# Patient Record
Sex: Female | Born: 1935 | Race: White | Hispanic: No
Health system: Southern US, Community
[De-identification: ages and names within clinical notes are randomized; demographics above are authoritative.]

## PROBLEM LIST (undated history)

## (undated) DIAGNOSIS — I10 Essential (primary) hypertension: Secondary | ICD-10-CM

## (undated) DIAGNOSIS — E119 Type 2 diabetes mellitus without complications: Secondary | ICD-10-CM

## (undated) HISTORY — PX: KIDNEY SURGERY: SHX687

## (undated) HISTORY — PX: OTHER SURGICAL HISTORY: SHX169

---

## 2008-10-14 ENCOUNTER — Ambulatory Visit: Payer: Self-pay | Admitting: Family Medicine

## 2008-10-14 DIAGNOSIS — S99919A Unspecified injury of unspecified ankle, initial encounter: Secondary | ICD-10-CM

## 2008-10-14 DIAGNOSIS — E119 Type 2 diabetes mellitus without complications: Secondary | ICD-10-CM

## 2008-10-14 DIAGNOSIS — I10 Essential (primary) hypertension: Secondary | ICD-10-CM | POA: Insufficient documentation

## 2008-10-14 DIAGNOSIS — S99929A Unspecified injury of unspecified foot, initial encounter: Secondary | ICD-10-CM

## 2008-10-14 DIAGNOSIS — S8990XA Unspecified injury of unspecified lower leg, initial encounter: Secondary | ICD-10-CM

## 2016-07-06 ENCOUNTER — Encounter: Payer: Self-pay | Admitting: Emergency Medicine

## 2016-07-06 ENCOUNTER — Emergency Department (INDEPENDENT_AMBULATORY_CARE_PROVIDER_SITE_OTHER): Payer: Medicare HMO

## 2016-07-06 ENCOUNTER — Emergency Department
Admission: EM | Admit: 2016-07-06 | Discharge: 2016-07-06 | Disposition: A | Discharge status: Home / Self Care | Payer: Medicare HMO | Source: Home / Self Care | Attending: Family Medicine | Admitting: Family Medicine

## 2016-07-06 DIAGNOSIS — S52502A Unspecified fracture of the lower end of left radius, initial encounter for closed fracture: Secondary | ICD-10-CM | POA: Diagnosis not present

## 2016-07-06 DIAGNOSIS — W19XXXA Unspecified fall, initial encounter: Secondary | ICD-10-CM

## 2016-07-06 DIAGNOSIS — S52615A Nondisplaced fracture of left ulna styloid process, initial encounter for closed fracture: Secondary | ICD-10-CM

## 2016-07-06 HISTORY — DX: Essential (primary) hypertension: I10

## 2016-07-06 HISTORY — DX: Type 2 diabetes mellitus without complications: E11.9

## 2016-07-06 NOTE — ED Triage Notes (Signed)
Patient presents to [facility] with daughter. Complaint of left forearm pain, patient states she fell while vacuuming yesterday

## 2016-07-06 NOTE — ED Provider Notes (Signed)
[facility]    CSN: [id number] Arrival date & time: 07/06/16  1158     History   Chief Complaint Chief Complaint  Patient presents with  . Arm Pain    HPI Lindsey Douglas is a 83 y.o. female.   Patient tripped over a vacuum cleaner wire yesterday at 4pm at home.  She braced herself with her left hand/wrist, resulting in persistent left hand and wrist pain.  No loss of consciousness.  No other injury    Wrist Pain  This is a new problem. The current episode started yesterday. The problem occurs constantly. The problem has not changed since onset.Associated symptoms comments: none. Nothing relieves the symptoms. Treatments tried: ice pack. The treatment provided mild relief.    Past Medical History:  Diagnosis Date  . Diabetes mellitus without complication (HCC)   . Hypertension     Patient Active Problem List   Diagnosis Date Noted  . DIABETES MELLITUS, TYPE II 10/14/2008  . HYPERTENSION 10/14/2008  . TOE INJURY 10/14/2008    Past Surgical History:  Procedure Laterality Date  . kidney cancer    . KIDNEY SURGERY      OB History    No data available       Home Medications    Prior to Admission medications   Medication Sig Start Date End Date Taking? Authorizing Provider  amlodipine-atorvastatin (CADUET) 2.5-20 MG tablet Take 1 tablet by mouth daily.   Yes Historical Provider, MD  escitalopram (LEXAPRO) 10 MG tablet Take 10 mg by mouth daily.   Yes Historical Provider, MD  glipiZIDE (GLUCOTROL) 10 MG tablet Take 10 mg by mouth daily before breakfast.   Yes Historical Provider, MD  linagliptin (TRADJENTA) 5 MG TABS tablet Take 5 mg by mouth daily.   Yes Historical Provider, MD  lisinopril (PRINIVIL,ZESTRIL) 20 MG tablet Take 20 mg by mouth daily.   Yes Historical Provider, MD    Family History History reviewed. No pertinent family history.  Social History Social History  Substance Use Topics  . Smoking status: Passive Smoke Exposure -  Never Smoker  . Smokeless tobacco: Current User  . Alcohol use No     Allergies   Contrast media [iodinated diagnostic agents]   Review of Systems Review of Systems  All other systems reviewed and are negative.    Physical Exam Triage Vital Signs ED Triage Vitals [07/06/16 1226]  Enc Vitals Group     BP (!) 169/69     Pulse Rate 63     Resp      Temp 97.7 F (36.5 C)     Temp Source Oral     SpO2 98 %     Weight 144 lb 8 oz (65.5 kg)     Height      Head Circumference      Peak Flow      Pain Score 8     Pain Loc      Pain Edu?      Excl. in GC?    No data found.   Updated Vital Signs BP (!) 169/69 (BP Location: Left Arm)   Pulse 63   Temp 97.7 F (36.5 C) (Oral)   Wt 144 lb 8 oz (65.5 kg)   SpO2 98%   BMI 25.60 kg/m   Visual Acuity Right Eye Distance:   Left Eye Distance:   Bilateral Distance:    Right Eye Near:   Left Eye Near:    Bilateral Near:  Physical Exam  Constitutional: She appears well-developed and well-nourished. No distress.  HENT:  Head: Atraumatic.  Eyes: Pupils are equal, round, and reactive to light.  Neck: Normal range of motion.  Cardiovascular: Normal rate.   Pulmonary/Chest: Effort normal.  Musculoskeletal:       Left wrist: She exhibits decreased range of motion, tenderness, bony tenderness and swelling. She exhibits no deformity and no laceration.       Arms: Left wrist has diffuse swelling and tenderness to palpation over distal radius and ulna.  Distal neurovascular function is intact.   Neurological: She is alert.  Skin: Skin is warm and dry.  Nursing note and vitals reviewed.    UC Treatments / Results  Labs (all labs ordered are listed, but only abnormal results are displayed) Labs Reviewed - No data to display  EKG  EKG Interpretation None       Radiology Dg Forearm Left  Result Date: 07/06/2016 CLINICAL DATA:  83 year old female with left wrist pain after falling yesterday EXAM: LEFT FOREARM -  2 VIEW COMPARISON:  None FINDINGS: Acute mildly impacted distal radius fracture with involvement of the articular surface. Additionally, there is a nondisplaced chip fracture of the ulnar styloid. On the lateral view there is a nonspecific bony fragment dorsal to the carpal bones which may reflect an additional fracture site. The proximal and mid radius and ulna are intact and unremarkable. Soft tissue swelling is present about the wrist. IMPRESSION: 1. Acute mildly comminuted and impacted distal radius fracture with involvement of the articular surface. 2. Nondisplaced chip fracture from the ulnar styloid. 3. Bony fragment posterior to the proximal carpal row on the lateral view concerning for an additional carpal fracture. Recommend dedicated radiographs of the wrist. Electronically Signed   By: Malachy Moan M.D.   On: 07/06/2016 12:56    Procedures Procedures (including critical care time)  Medications Ordered in UC Medications - No data to display   Initial Impression / Assessment and Plan / UC Course  I have reviewed the triage vital signs and the nursing notes.  Pertinent labs & imaging results that were available during my care of the patient were reviewed by me and considered in my medical decision making (see chart for details).    Discussed with Dr. Rodney Langton.  Sugar tong splint applied and sling dispensed. Elevate arm (wear sling).  Apply ice pack for 30 to 40 minutes, 3 to 4 times daily  Continue until pain and swelling decrease.  May take Tylenol as needed for pain. Recommend calcium and vitamin D supplement such as Citracal + D. Followup with Dr. Rodney Langton or Dr. Clementeen [facility] ([facility]) in 48 hours for casting and fracture management.    Final Clinical Impressions(s) / UC Diagnoses   Final diagnoses:  Closed fracture of distal end of left radius, unspecified fracture morphology, initial encounter  Nondisplaced fracture of left ulna styloid  process, initial encounter for closed fracture    New Prescriptions New Prescriptions   No medications on file     Lattie Haw, MD 07/06/16 1406

## 2016-07-06 NOTE — Discharge Instructions (Signed)
Elevate arm (wear sling).  Apply ice pack for 30 to 40 minutes, 3 to 4 times daily  Continue until pain and swelling decrease.  May take Tylenol as needed for pain. Recommend calcium and vitamin D supplement such as Citracal + D.

## 2016-07-08 ENCOUNTER — Ambulatory Visit (INDEPENDENT_AMBULATORY_CARE_PROVIDER_SITE_OTHER): Payer: Medicare HMO

## 2016-07-08 ENCOUNTER — Ambulatory Visit (INDEPENDENT_AMBULATORY_CARE_PROVIDER_SITE_OTHER): Payer: Medicare HMO | Admitting: Sports Medicine

## 2016-07-08 ENCOUNTER — Encounter: Payer: Self-pay | Admitting: Sports Medicine

## 2016-07-08 DIAGNOSIS — S52602A Unspecified fracture of lower end of left ulna, initial encounter for closed fracture: Secondary | ICD-10-CM | POA: Diagnosis not present

## 2016-07-08 DIAGNOSIS — S52502D Unspecified fracture of the lower end of left radius, subsequent encounter for closed fracture with routine healing: Secondary | ICD-10-CM | POA: Diagnosis not present

## 2016-07-08 DIAGNOSIS — W19XXXD Unspecified fall, subsequent encounter: Secondary | ICD-10-CM

## 2016-07-08 DIAGNOSIS — S52502A Unspecified fracture of the lower end of left radius, initial encounter for closed fracture: Secondary | ICD-10-CM

## 2016-07-08 MED ORDER — HYDROCODONE-ACETAMINOPHEN 10-325 MG PO TABS: 0.5000 | ORAL_TABLET | Freq: Three times a day (TID) | ORAL | 0 refills | Status: AC | PRN

## 2016-07-08 NOTE — Assessment & Plan Note (Signed)
Closed reduction as above, hydrocodone for pain, Sugar tong splint applied. Postreduction x-rays. Return to see me next week, repeat x-rays before visit.

## 2016-07-08 NOTE — Progress Notes (Signed)
   Subjective:    I'm seeing this patient as a consultation for:  Dr. Donna Christen  CC: left arm fracture  HPI: This is a pleasant 83 year old female, this weekend she fell onto an outstretched hand, she had a fracture with immediate pain, swelling, bruising. In urgent care x-rays showed a partially comminuted dorsally impacted distal radius fracture, she was placed in a sugar tong splint appropriately and referred to me for further evaluation and definitive treatment.  Past medical history:  Negative.  See flowsheet/record as well for more information.  Surgical history: Negative.  See flowsheet/record as well for more information.  Family history: Negative.  See flowsheet/record as well for more information.  Social history: Negative.  See flowsheet/record as well for more information.  Allergies, and medications have been entered into the medical record, reviewed, and no changes needed.   Review of Systems: No headache, visual changes, nausea, vomiting, diarrhea, constipation, dizziness, abdominal pain, skin rash, fevers, chills, night sweats, weight loss, swollen lymph nodes, body aches, joint swelling, muscle aches, chest pain, shortness of breath, mood changes, visual or auditory hallucinations.   Objective:   General: Well Developed, well nourished, and in no acute distress.  Neuro/Psych: Alert and oriented x3, extra-ocular muscles intact, able to move all 4 extremities, sensation grossly intact. Skin: Warm and dry, no rashes noted.  Respiratory: Not using accessory muscles, speaking in full sentences, trachea midline.  Cardiovascular: Pulses palpable, no extremity edema. Abdomen: Does not appear distended. Left wrist: Visibly swollen, mild bruising, tender over the distal radius and the distal ulnar styloid. Neurovascularly intact distally.  Procedure:  Fracture Reduction   Risks, benefits, and alternatives explained and consent obtained. Time out conducted. Surface prepped  with alcohol. 10cc lidocaine and bupivacaine infiltrated in a hematoma block. Adequate anesthesia ensured. Fracture reduction: Using finger traps I accentuated the fracture deformity, I then applied axial force and reduced the fracture, it was then splinted with the wrist in flexion with a sugar tong splint. Post reduction films obtained showed improvement in the fracture alignment, specifically radial height and inclination. Pt stable, aftercare and follow-up advised.  Impression and Recommendations:   This case required medical decision making of moderate complexity.  Fracture of distal radius and ulna, left, closed, initial encounter Closed reduction as above, hydrocodone for pain, Sugar tong splint applied. Postreduction x-rays. Return to see me next week, repeat x-rays before visit.

## 2016-07-08 NOTE — Addendum Note (Signed)
Addended by: Monica Becton on: 07/08/2016 05:45 PM   Modules accepted: Orders

## 2016-07-15 ENCOUNTER — Ambulatory Visit (INDEPENDENT_AMBULATORY_CARE_PROVIDER_SITE_OTHER): Payer: Medicare HMO | Admitting: Sports Medicine

## 2016-07-15 ENCOUNTER — Ambulatory Visit (INDEPENDENT_AMBULATORY_CARE_PROVIDER_SITE_OTHER): Payer: Medicare HMO

## 2016-07-15 ENCOUNTER — Encounter: Payer: Self-pay | Admitting: Sports Medicine

## 2016-07-15 DIAGNOSIS — S52602A Unspecified fracture of lower end of left ulna, initial encounter for closed fracture: Principal | ICD-10-CM

## 2016-07-15 DIAGNOSIS — W19XXXD Unspecified fall, subsequent encounter: Secondary | ICD-10-CM | POA: Diagnosis not present

## 2016-07-15 DIAGNOSIS — S52502D Unspecified fracture of the lower end of left radius, subsequent encounter for closed fracture with routine healing: Secondary | ICD-10-CM | POA: Diagnosis not present

## 2016-07-15 DIAGNOSIS — S52502A Unspecified fracture of the lower end of left radius, initial encounter for closed fracture: Secondary | ICD-10-CM

## 2016-07-15 NOTE — Progress Notes (Signed)
  Subjective: 1 week post closed reduction of a left distal radius fracture, doing well.   Objective: General: Well-developed, well-nourished, and in no acute distress. Left arm: Fingers are still swollen as expected, I rewrapped the splint.  X-rays reviewed, they show stability of the anatomic alignment of the reduced distal radius fracture with some resorption.  Assessment/plan:   Fracture of distal radius and ulna, left, closed, initial encounter 1 week post fracture, doing well, I rewrapped the sugar tong splint, return in one week, x-ray before visit. Has not been using the narcotic, I asked her to cut in half and then try it.

## 2016-07-15 NOTE — Assessment & Plan Note (Signed)
1 week post fracture, doing well, I rewrapped the sugar tong splint, return in one week, x-ray before visit. Has not been using the narcotic, I asked her to cut in half and then try it.

## 2016-07-23 ENCOUNTER — Encounter: Payer: Self-pay | Admitting: Sports Medicine

## 2016-07-23 ENCOUNTER — Ambulatory Visit (INDEPENDENT_AMBULATORY_CARE_PROVIDER_SITE_OTHER): Payer: Medicare HMO

## 2016-07-23 ENCOUNTER — Ambulatory Visit (INDEPENDENT_AMBULATORY_CARE_PROVIDER_SITE_OTHER): Payer: Medicare HMO | Admitting: Sports Medicine

## 2016-07-23 DIAGNOSIS — S52602A Unspecified fracture of lower end of left ulna, initial encounter for closed fracture: Secondary | ICD-10-CM

## 2016-07-23 DIAGNOSIS — W19XXXD Unspecified fall, subsequent encounter: Secondary | ICD-10-CM

## 2016-07-23 DIAGNOSIS — S52502A Unspecified fracture of the lower end of left radius, initial encounter for closed fracture: Secondary | ICD-10-CM

## 2016-07-23 DIAGNOSIS — S52502D Unspecified fracture of the lower end of left radius, subsequent encounter for closed fracture with routine healing: Secondary | ICD-10-CM

## 2016-07-23 NOTE — Assessment & Plan Note (Signed)
Doing well post closed reduction of a comminuted left distal radius fracture, transition to a short arm cast, no pain medication needed. She only has mild deformity. Return to see me in 2 weeks.

## 2016-07-23 NOTE — Progress Notes (Signed)
  Subjective:  2 weeks post closed reduction of a left distal radius fracture, doing well.  Objective: General: Well-developed, well-nourished, and in no acute distress. Left wrist: Splint is removed, still tender over the fracture with only mild residual deformity.  Short arm cast placed.  X-rays reviewed, she still has a comminuted distal radius fracture but the radial length, inclination is maintained post reduction.  Assessment/plan:   Fracture of distal radius and ulna, left, closed, initial encounter Doing well post closed reduction of a comminuted left distal radius fracture, transition to a short arm cast, no pain medication needed. She only has mild deformity. Return to see me in 2 weeks.

## 2016-08-06 ENCOUNTER — Ambulatory Visit: Payer: Medicare HMO | Admitting: Sports Medicine

## 2016-08-06 ENCOUNTER — Encounter: Payer: Self-pay | Admitting: Sports Medicine

## 2016-08-06 DIAGNOSIS — S52602A Unspecified fracture of lower end of left ulna, initial encounter for closed fracture: Principal | ICD-10-CM

## 2016-08-06 DIAGNOSIS — S52502A Unspecified fracture of the lower end of left radius, initial encounter for closed fracture: Secondary | ICD-10-CM

## 2016-08-06 NOTE — Progress Notes (Signed)
  Subjective: 4 weeks post closed reduction, doing well, pain-free, has a few areas on the cast that are rubbing her hand.   Objective: General: Well-developed, well-nourished, and in no acute distress. Left wrist: Cast is in good shape, fit is good, swelling in the fingers is improving. I did trim a few bits of the cast to make it more comfortable.  Assessment/plan:   Fracture of distal radius and ulna, left, closed, initial encounter 4 weeks post distal radius fracture closed reduction, cast is in good shape, I did trim a few parts of it. Return in 2 weeks, we will probably remove the cast and get an x-ray at that time.

## 2016-08-06 NOTE — Assessment & Plan Note (Signed)
4 weeks post distal radius fracture closed reduction, cast is in good shape, I did trim a few parts of it. Return in 2 weeks, we will probably remove the cast and get an x-ray at that time.

## 2016-08-20 ENCOUNTER — Ambulatory Visit (INDEPENDENT_AMBULATORY_CARE_PROVIDER_SITE_OTHER): Payer: Medicare HMO

## 2016-08-20 ENCOUNTER — Encounter: Payer: Self-pay | Admitting: Sports Medicine

## 2016-08-20 ENCOUNTER — Ambulatory Visit: Payer: Medicare HMO | Admitting: Sports Medicine

## 2016-08-20 DIAGNOSIS — S52602A Unspecified fracture of lower end of left ulna, initial encounter for closed fracture: Secondary | ICD-10-CM

## 2016-08-20 DIAGNOSIS — W19XXXD Unspecified fall, subsequent encounter: Secondary | ICD-10-CM | POA: Diagnosis not present

## 2016-08-20 DIAGNOSIS — S52502A Unspecified fracture of the lower end of left radius, initial encounter for closed fracture: Secondary | ICD-10-CM

## 2016-08-20 NOTE — Assessment & Plan Note (Signed)
6 weeks post fracture, doing well.  X-rays, physical therapy, return in 4 week

## 2016-08-20 NOTE — Progress Notes (Signed)
  Subjective: 6 weeks post closed reduction of the distal radius fracture, in cast, doing well.  Objective: General: Well-developed, well-nourished, and in no acute distress. Left Wrist: Slight deformity, minimal tenderness over the fracture, otherwise doing well.  Assessment/plan:   Fracture of distal radius and ulna, left, closed, initial encounter  6 weeks post fracture, doing well.  X-rays, physical therapy, return in 4 week

## 2016-08-23 ENCOUNTER — Ambulatory Visit (INDEPENDENT_AMBULATORY_CARE_PROVIDER_SITE_OTHER): Payer: Medicare HMO | Admitting: Physical Therapy

## 2016-08-23 ENCOUNTER — Encounter: Payer: Self-pay | Admitting: Physical Therapy

## 2016-08-23 DIAGNOSIS — M79642 Pain in left hand: Secondary | ICD-10-CM

## 2016-08-23 DIAGNOSIS — M6281 Muscle weakness (generalized): Secondary | ICD-10-CM

## 2016-08-23 DIAGNOSIS — M25642 Stiffness of left hand, not elsewhere classified: Secondary | ICD-10-CM | POA: Diagnosis not present

## 2016-08-23 NOTE — Patient Instructions (Addendum)
Roderic ScarceSusan Fermon Ureta [phone] Use ice for swelling, alternate ice/heat for pain, 10-15 min each  WALKING  Walking is a great form of exercise to increase your strength, endurance and overall fitness.  A walking program can help you start slowly and gradually build endurance as you go.  Everyone's ability is different, so each person's starting point will be different.  You do not have to follow them exactly.  The are just samples. You should simply find out what's right for you and stick to that program.   In the beginning, you'll start off walking 2-3 times a day for short distances.  As you get stronger, you'll be walking further at just 1-2 times per day.  A. You Can Walk For A Certain Length Of Time Each Day    Walk 5 minutes 3 times per day. ( one or two songs )   Increase 2 minutes ( or one song)  every 2-3 days (3 times per day).  Work up to 25-30 minutes (1-2 times per day).   Example:   Day 1-2 5 minutes 3 times per day   Day 7-8 12 minutes 2-3 times per day   Day 13-14 25 minutes 1-2 times per day      Flexion: ROM With MPs Extended - use right hand to apply overpressure to left fingers to form a fist.      Bend left fingers into a fist as far as possible.  Right hand assists.  Repeat _5-10__ times. Repeat with other hand. Do __3-4_ sessions per day.   Finger Opposition    Actively touch left thumb to each fingertip. Start with index finger and proceed toward little finger. Move slowly at first, then more rapidly as motion and coordination improve. Be sure to touch each fingertip. Repeat __5-10__ times per set. Do _1___ sets per session. Do __3-4__ sessions per day.  AROM: Finger Flexion / Extension - use right hand to help     Actively bend fingers of left hand. Start with knuckles furthest from palm, and slowly make a fist. Hold _3-5___ seconds. Relax. Then straighten fingers as far as possible. Repeat __5-10__ times per set. Do __1__ sets per session. Do _3-4___  sessions per day.   Wrist Extensor Stretch    Keeping elbow straight, grasp left hand and slowly bend wrist forward until stretch is felt. Hold __20-30__ seconds. Relax. Repeat ___2_ times per set. Do __1__ sets per session. Do __3-4__ sessions per day.  Wrist Flexor Stretch    Keeping elbow straight, grasp left hand and slowly bend wrist back until stretch is felt. Hold __20-30__ seconds. Relax. Repeat __2__ times per set. Do __1__ sets per session. Do __3-4__ sessions per day.   Towel Roll Squeeze    With right forearm resting on surface, gently squeeze towel. Repeat __5-10__ times per set. Do _1___ sets per session. Do _3-4___ sessions per day.  Forearm Supination / Pronation: Resisted - keep elbow by your side.     With _1___ pound object in right hand, slowly turn palm up, then down. Repeat __5-10__ times per set. Do _1___ sets per session. Do __3-4__ sessions per day.   Wrist Flexor Stretch    With palms resting comfortably on the edge of the table, slowly move body over hands until gentle stretch is felt in forearms. Hold __20-30__ seconds. Relax. Repeat _1-2___ times per set. Do __1__ sets per session. Do _3-4___ sessions per day.   Keep Left hand above heart as much as possible  and keep fingers moving.    After 1-2 weeks when motion is improved and pain is settled down start these: start with ~ 1# or a soup/vegetable can, slowly build weight up.  Start with 2x10 reps, build to 2x15 reps once a day.      Wrist Flexion: Resisted    With right palm up, ____ pound weight in hand, bend wrist up. Return slowly. Repeat ____ times per set. Do ____ sets per session. Do ____ sessions per day.  Copyright  VHI. All rights reserved.  Wrist Extension: Resisted    With right palm down, ____ pound weight in hand, bend wrist up. Return slowly. Repeat ____ times per set. Do ____ sets per session. Do ____ sessions per day.  Copyright  VHI. All rights reserved.    Wrist Radial Deviation: Resisted    With right thumb up, ____ pound weight in hand, bend wrist up. Return slowly. Repeat ____ times per set. Do ____ sets per session. Do ____ sessions per day.  Copyright  VHI. All rights reserved.  Elbow Flexion: Resisted    With right arm straight, thumb forward, Holding ____ pound weight, bend elbow. Return slowly. Repeat ____ times per set. Do ____ sets per session. Do ____ sessions per day.  Copyright  [employer]HI. All rights reserved.  Forearm Pronation / Supination: Resisted (Sitting)    With right forearm supported, grasp object and gently rotate palm up, then down, as far as possible without pain. Repeat ____ times per set. Do ____ sets per session. Do ____ sessions per day.  Copyright  VHI. All rights reserved.  Finger Extension / Thumb Abduction: Resisted    With rubber band around right thumb and ________ fingers, hand slightly cupped, gently spread thumb and fingers apart. Repeat ____ times per set. Do ____ sets per session. Do ____ sessions per day.  Copyright  VHI. All rights reserved.

## 2016-08-23 NOTE — Therapy (Signed)
[facility] [address] [place], [place], [address] Phone: [phone]   Fax:  [phone]  Physical Therapy Evaluation  Patient Details  Name: Lindsey Douglas MRN: [id number] Date of Birth: [birth year] Referring Provider: Dr Benjamin Stain  Encounter Date: 08/23/2016      PT End of Session - 08/23/16 1438    Visit Number 1   Number of Visits 1   PT Start Time 1438   PT Stop Time 1538   PT Time Calculation (min) 60 min   Activity Tolerance Patient tolerated treatment well   Behavior During Therapy [facility] for tasks assessed/performed      Past Medical History:  Diagnosis Date  . Diabetes mellitus without complication (HCC)   . Hypertension     Past Surgical History:  Procedure Laterality Date  . kidney cancer    . KIDNEY SURGERY      There were no vitals filed for this visit.       Subjective Assessment - 08/23/16 1438    Subjective Pt reports she fell vacumming landed on her Lt hand and had a fracture, 07/12/16. Saw MD next day, x-rays showed fx distal radius and ulnar.  Was splinted for a week then casted for 4 more weeks.  Had a sling PRN.    Currently in Pain? Yes   Pain Score 2    Pain Location Arm   Pain Orientation Left   Pain Descriptors / Indicators Dull   Pain Type Acute pain   Pain Onset More than a month ago   Pain Frequency Intermittent   Aggravating Factors  certain movements, supination, dependent position   Pain Relieving Factors rest .             [facility] PT Assessment - 08/23/16 0001      Assessment   Medical Diagnosis Lt distal radius and ulnar fx   Referring Provider Dr Benjamin Stain   Onset Date/Surgical Date 07/12/16   Hand Dominance Right   Next MD Visit 09/16/16   Prior Therapy none     Precautions   Precaution Comments 2# lifting restrictions     Balance Screen   Has the patient fallen in the past 6 months Yes   How many times? multiple falls   3-4 at least per great  granddaughter.    Has the patient had a decrease in activity level because of a fear of falling?  Yes  pt has discussed this with her MD   Is the patient reluctant to leave their home because of a fear of falling?  Yes     Home Environment   Living Environment Private residence   Living Arrangements Alone  recent death of spouse   Home Access Stairs to enter   Entrance Stairs-Number of Steps 3   Home Layout One level     Prior Function   Level of Independence Independent  unable to hook her bra - hasn't attempted    Vocation Retired   Leisure play with dog, read  - able to turn pages with left hand now. hasn't held cup yet      Observation/Other Assessments   Focus on Therapeutic Outcomes (FOTO)  NA     Observation/Other Assessments-Edema    Edema --  (+) edema in Lt hand, visible atrophy in Lt forearm      Coordination   Fine Motor Movements are Fluid and Coordinated Yes  unable to perform opposition in 3rd-5th finger     Posture/Postural Control  Posture/Postural Control Postural limitations   Postural Limitations Rounded Shoulders;Forward head;Increased thoracic kyphosis     ROM / Strength   AROM / PROM / Strength AROM;Strength;PROM     AROM   AROM Assessment Site Wrist;Forearm;Elbow;Shoulder   Right/Left Shoulder --  Lt pain with rotation at elbow, shoulder limited , functiona   Right/Left Elbow Left  Rt WNl   Left Elbow Flexion 140  Rt 145   Left Elbow Extension -5  equal to Rt   Right/Left Forearm Left   Left Forearm Pronation 76 Degrees   Left Forearm Supination 85 Degrees   Right/Left Wrist Left   Left Wrist Extension 35 Degrees   Left Wrist Flexion 42 Degrees   Left Wrist Radial Deviation 8 Degrees   Left Wrist Ulnar Deviation 22 Degrees     PROM   PROM Assessment Site Wrist   Right/Left Wrist Left   Left Wrist Extension 52 Degrees   Left Wrist Flexion 47 Degrees   Left Wrist Radial Deviation 15 Degrees   Left Wrist Ulnar Deviation 25 Degrees      Strength   Strength Assessment Site Shoulder;Elbow;Forearm;Wrist;Hand   Right/Left Shoulder --  Rt WFL, Lt grossly 4+/5   Right/Left Elbow --  bilat grossly WNL   Right/Left Forearm --  supination 4+/5 with pain, pronation4-/5   Right/Left Wrist --  Lt grossly 4+/5   Right/Left hand --  unable to squeeze with Lt hand.             Objective measurements completed on examination: See above findings.       Performed HEP per handouts with education on exercise progression.  Also educated on starting a walking program.            PT Education - 08/23/16 1537    Education provided Yes   Education Details HEP   Person(s) Educated Patient;Other (comment)  great grand daughter   Methods Explanation;Demonstration;Handout   Comprehension Returned demonstration;Verbalized understanding             PT Long Term Goals - 08/23/16 1541      PT LONG TERM GOAL #1   Title I with initial HEP ( 08/23/16)    Status Achieved                Plan - 08/23/16 1538    Clinical Impression Statement 83 yo female presents for rehab after sustaining a Lt forearm fx in a fall.  She is on a fixed budget and reports she can only come for one visit.  She is accompanied by her great granddaughter.  She has significant edema in her Lt hand, limited ROM in the elbow, wrist and hand as well as decreased strength and postural changes.    Clinical Presentation Evolving   Clinical Decision Making Moderate   Rehab Potential Fair   Clinical Impairments Affecting Rehab Potential pt progress may be slow as she is unable to attend formal therapy   PT Frequency One time visit   PT Treatment/Interventions Therapeutic exercise;Patient/family education   PT Next Visit Plan pt discharged to HEP, instructed to call or stop in with questions, she verbalized ok to talk with any of her family members about her HEP   Consulted and Agree with Plan of Care Patient;Family member/caregiver   Family  Member Consulted great grand daughter.       Patient will benefit from skilled therapeutic intervention in order to improve the following deficits and impairments:  Pain, Increased edema, Decreased strength, Impaired  UE functional use, Decreased range of motion  Visit Diagnosis: Stiffness of left hand, not elsewhere classified - Plan: PT plan of care cert/re-cert  Muscle weakness (generalized) - Plan: PT plan of care cert/re-cert  Pain in left hand - Plan: PT plan of care cert/re-cert      [facility] PT PB G-CODES - 08/23/16 1543    Functional Assessment Tool Used  professional judgement -one time visit   Functional Limitations Carrying, moving and handling objects   Carrying, Moving and Handling Objects Current Status [phone]([id number]) At least 60 percent but less than 80 percent impaired, limited or restricted   Carrying, Moving and Handling Objects Goal Status ([id number]) At least 60 percent but less than 80 percent impaired, limited or restricted   Carrying, Moving and Handling Objects Discharge Status [phone]([id number]) At least 60 percent but less than 80 percent impaired, limited or restricted       Problem List Patient Active Problem List   Diagnosis Date Noted  . Fracture of distal radius and ulna, left, closed, initial encounter 07/08/2016  . DIABETES MELLITUS, TYPE II 10/14/2008  . HYPERTENSION 10/14/2008  . TOE INJURY 10/14/2008    Roderic ScarceSusan Shaver PT  08/23/2016, 3:46 PM  [facility] [address] [place], [place], [address] Phone: [phone]   Fax:  [phone]  Name: Lindsey Douglas MRN: [id number] Date of Birth: [birth year]

## 2016-09-17 ENCOUNTER — Ambulatory Visit: Payer: Medicare HMO | Admitting: Sports Medicine

## 2017-06-16 DEATH — deceased

## 2018-10-12 IMAGING — DX DG FOREARM 2V*L*
2 series · 2 of 2 positions shown · non-contrast
Comparison: None

CLINICAL DATA: 84-year-old female with left wrist pain after
falling yesterday

EXAM:
LEFT FOREARM - 2 VIEW

[forearm ap]
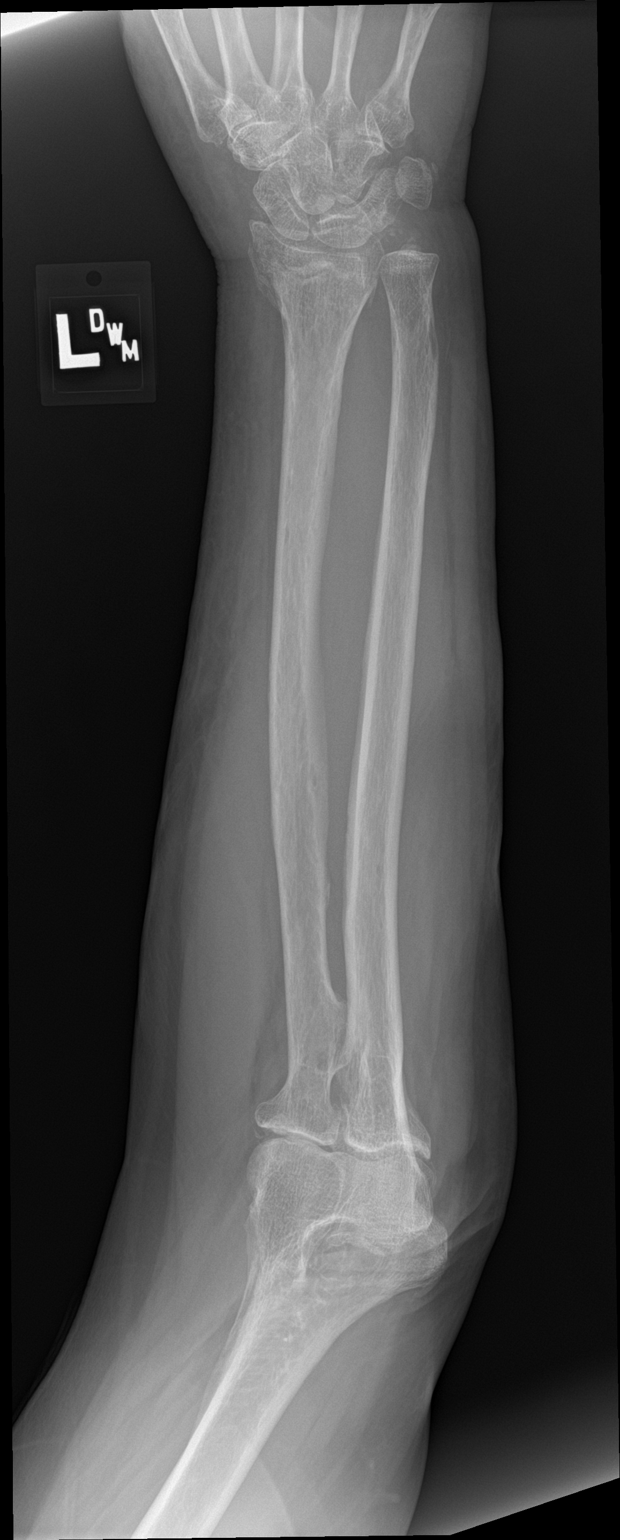

[forearm lat]
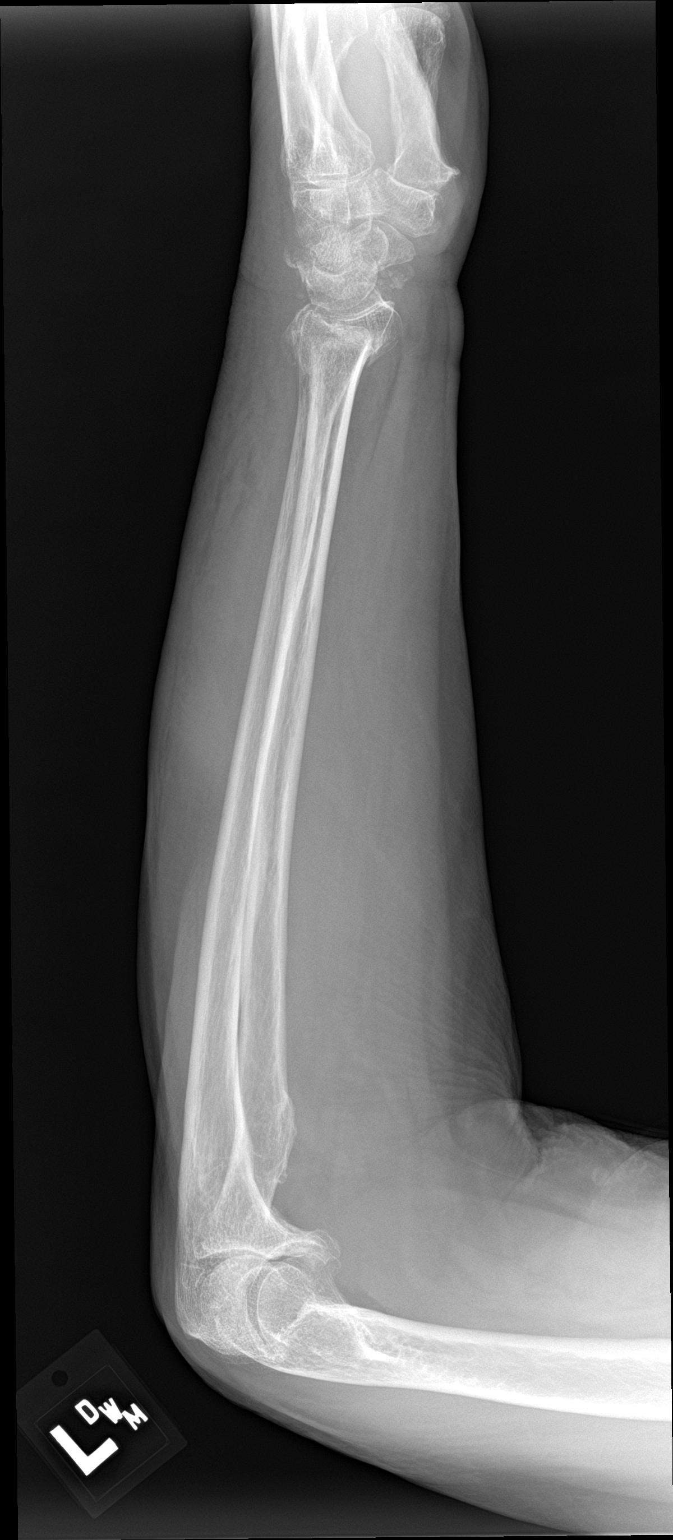

[2 of 2 positions shown; findings below may reference images not displayed]

FINDINGS: Acute mildly impacted distal radius fracture with involvement of the
articular surface. Additionally, there is a nondisplaced chip
fracture of the ulnar styloid. On the lateral view there is a
nonspecific bony fragment dorsal to the carpal bones which may
reflect an additional fracture site. The proximal and mid radius and
ulna are intact and unremarkable. Soft tissue swelling is present
about the wrist.
IMPRESSION: 1. Acute mildly comminuted and impacted distal radius fracture with
involvement of the articular surface.
2. Nondisplaced chip fracture from the ulnar styloid.
3. Bony fragment posterior to the proximal carpal row on the lateral
view concerning for an additional carpal fracture. Recommend
dedicated radiographs of the wrist.

## 2018-11-26 IMAGING — DX DG WRIST COMPLETE 3+V*L*
4 series · 4 of 4 positions shown · non-contrast
Comparison: Radiographs July 23, 2016.

CLINICAL DATA: Fracture of distal radius and ulna, left, closed.

EXAM:
LEFT WRIST - COMPLETE 3+ VIEW

[wrist pa]
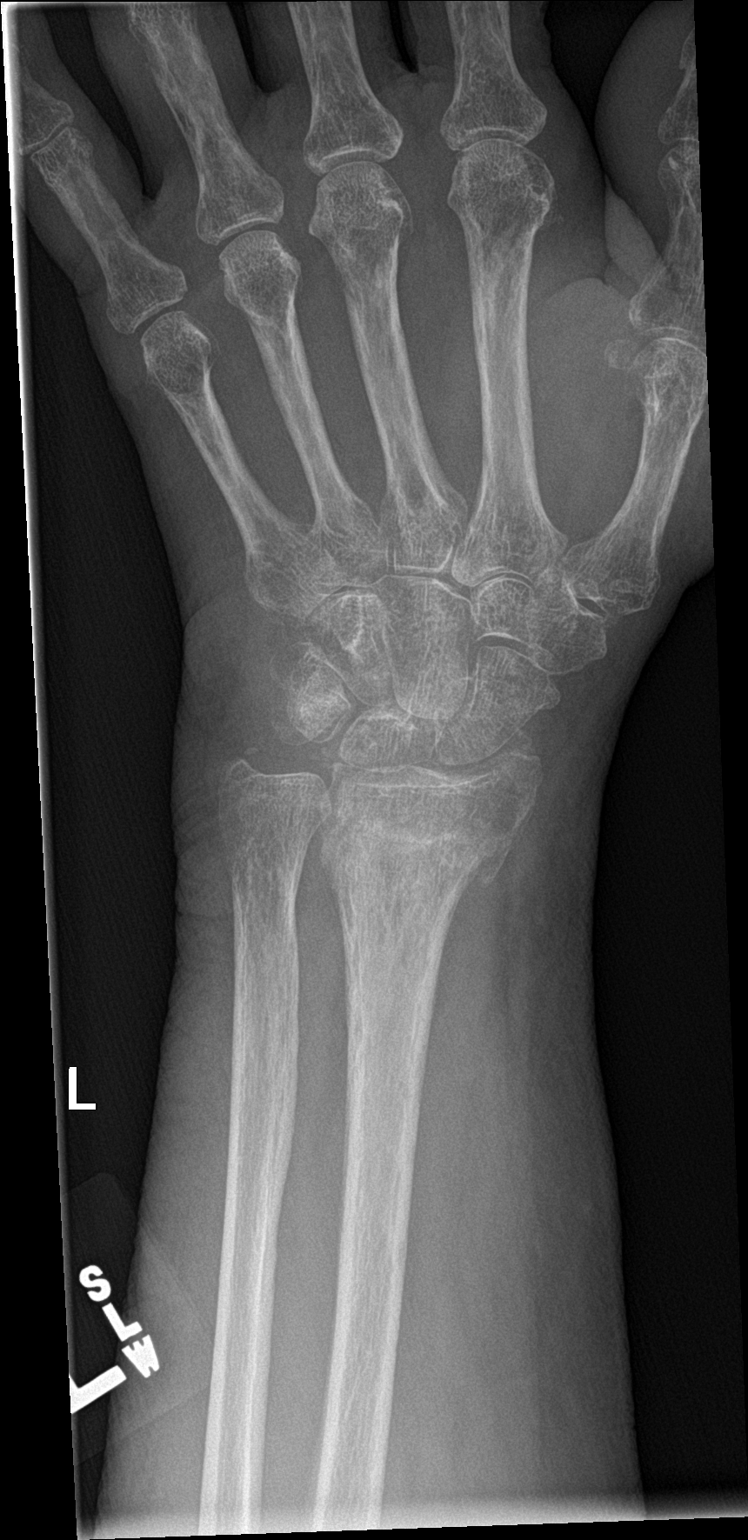

[wrist obl]
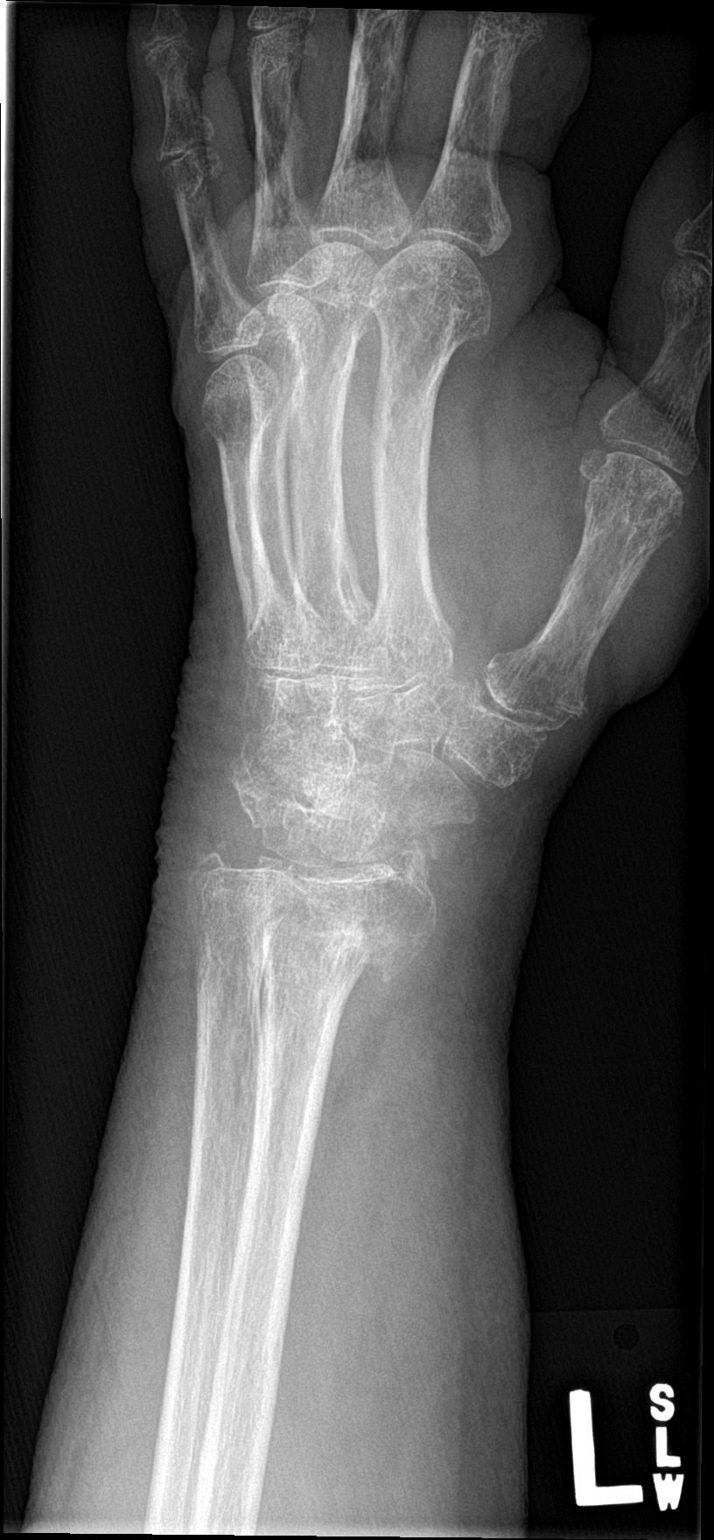

[wrist lat]
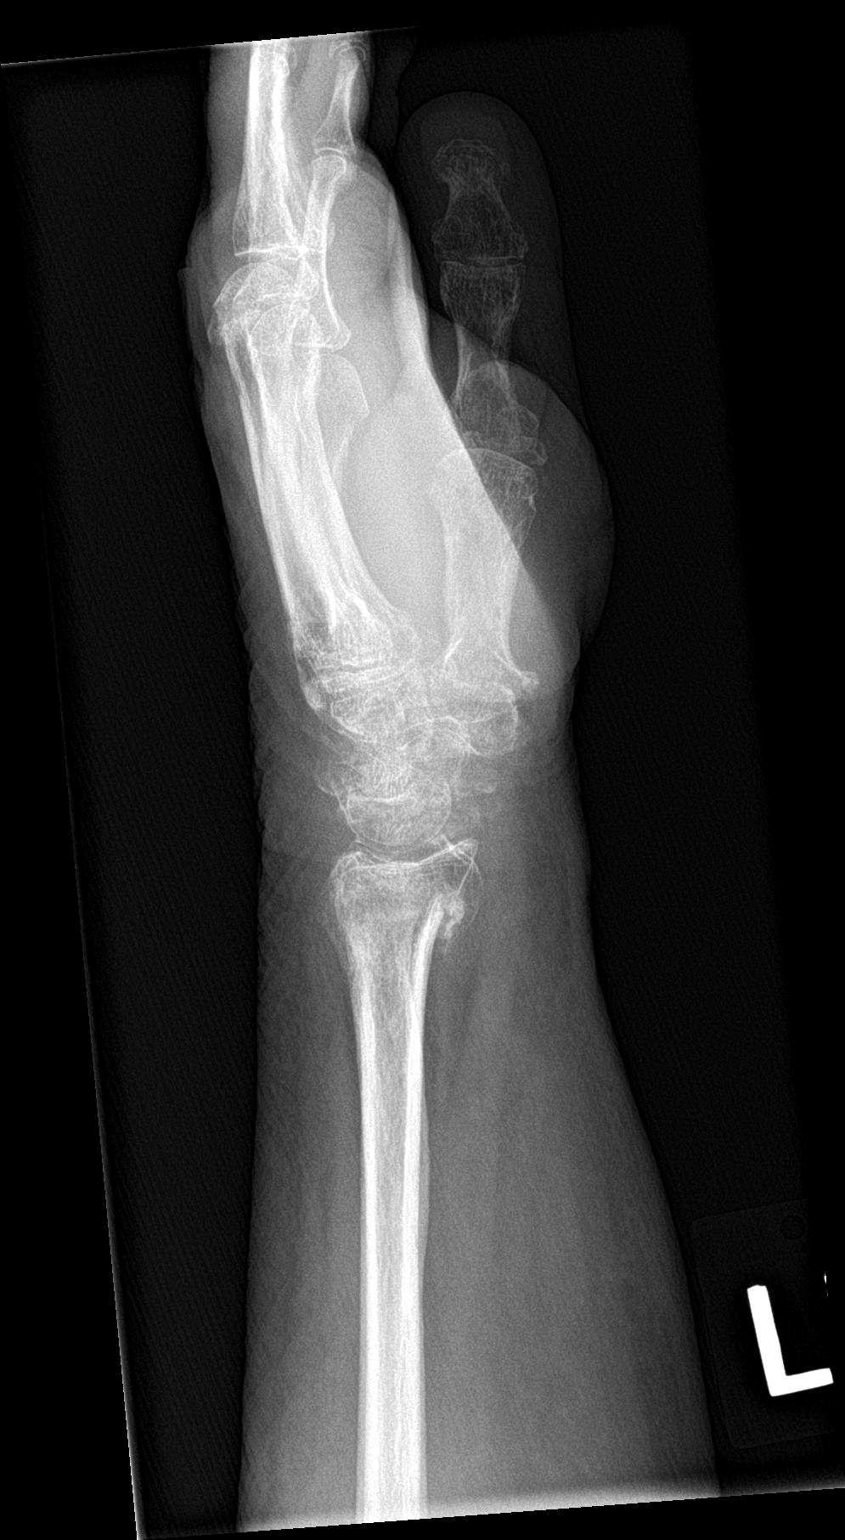

[wrist navicular]
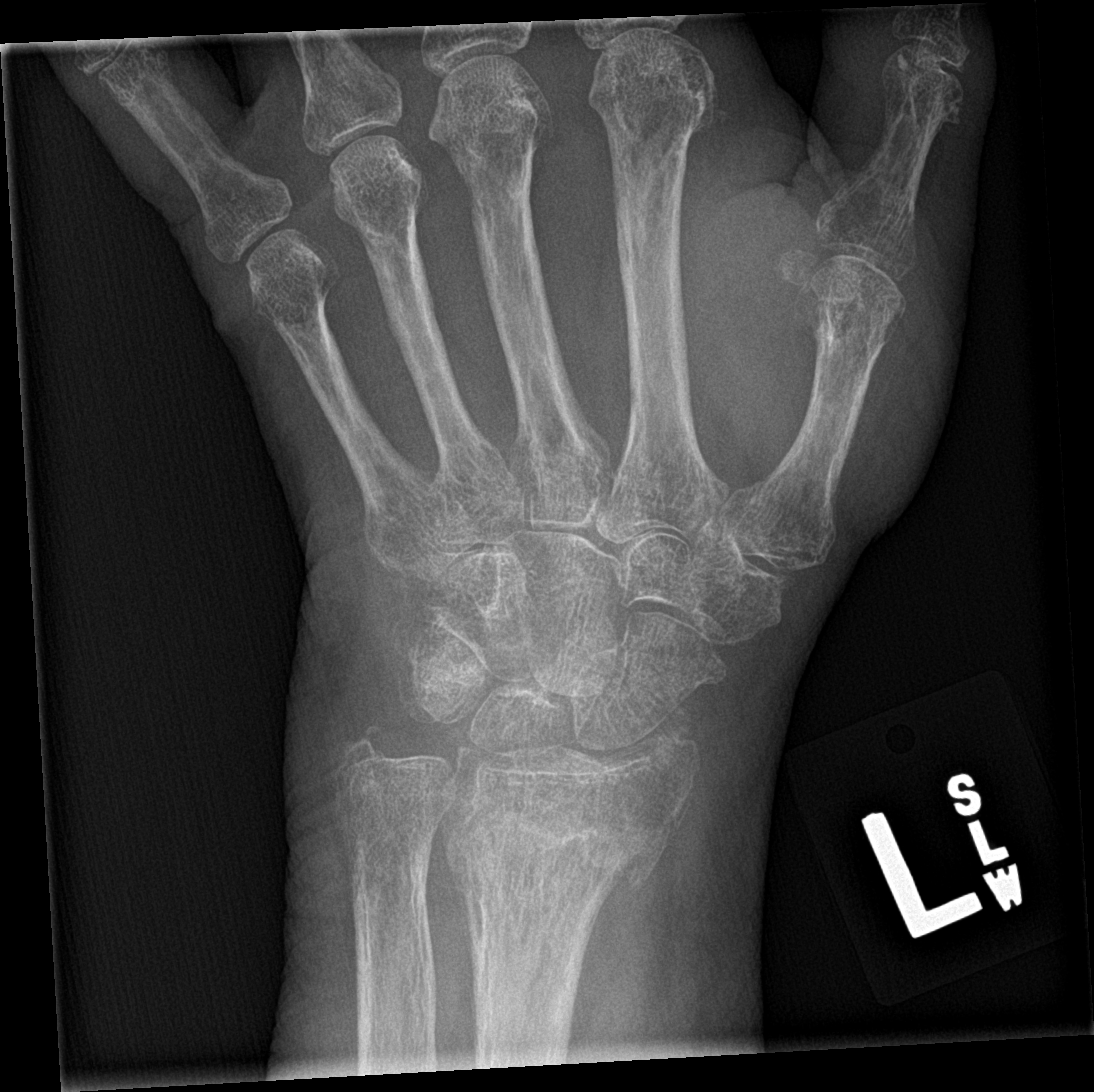

[4 of 4 positions shown; findings below may reference images not displayed]

FINDINGS: The cast has been removed. There remains comminuted distal radial
fracture with intra-articular extension. Sclerosis is seen
suggesting some healing, although some fracture lines remain. Mildly
displaced ulnar styloid fracture is noted.
IMPRESSION: Healing comminuted and displaced distal radial fracture with
intra-articular extension. Mildly displaced ulnar styloid fracture.
